# Patient Record
Sex: Female | Born: 1985 | Race: White | Hispanic: No | Marital: Single | Smoking: Never smoker
Health system: Southern US, Community
[De-identification: ages and names within clinical notes are randomized; demographics above are authoritative.]

## PROBLEM LIST (undated history)

## (undated) DIAGNOSIS — Z9889 Other specified postprocedural states: Secondary | ICD-10-CM

## (undated) DIAGNOSIS — F419 Anxiety disorder, unspecified: Secondary | ICD-10-CM

## (undated) DIAGNOSIS — L905 Scar conditions and fibrosis of skin: Secondary | ICD-10-CM

## (undated) DIAGNOSIS — D649 Anemia, unspecified: Secondary | ICD-10-CM

## (undated) DIAGNOSIS — T4145XA Adverse effect of unspecified anesthetic, initial encounter: Secondary | ICD-10-CM

## (undated) DIAGNOSIS — R112 Nausea with vomiting, unspecified: Secondary | ICD-10-CM

## (undated) DIAGNOSIS — T8859XA Other complications of anesthesia, initial encounter: Secondary | ICD-10-CM

---

## 2006-02-06 ENCOUNTER — Ambulatory Visit: Payer: Self-pay | Admitting: Family Medicine

## 2006-08-28 ENCOUNTER — Emergency Department: Payer: Self-pay | Admitting: Emergency Medicine

## 2010-03-11 ENCOUNTER — Emergency Department: Payer: Self-pay | Admitting: Unknown Physician Specialty

## 2010-08-07 ENCOUNTER — Ambulatory Visit: Payer: Self-pay | Admitting: Family Medicine

## 2017-03-29 ENCOUNTER — Ambulatory Visit
Admission: RE | Admit: 2017-03-29 | Discharge: 2017-03-29 | Disposition: A | Discharge status: Home / Self Care | Payer: BC Managed Care – PPO | Source: Ambulatory Visit | Attending: Medical Oncology | Admitting: Medical Oncology

## 2017-03-29 ENCOUNTER — Other Ambulatory Visit: Payer: Self-pay | Admitting: Medical Oncology

## 2017-03-29 DIAGNOSIS — N730 Acute parametritis and pelvic cellulitis: Secondary | ICD-10-CM

## 2017-03-29 DIAGNOSIS — N83202 Unspecified ovarian cyst, left side: Secondary | ICD-10-CM | POA: Insufficient documentation

## 2017-12-19 ENCOUNTER — Ambulatory Visit
Admission: RE | Admit: 2017-12-19 | Discharge: 2017-12-19 | Disposition: A | Discharge status: Home / Self Care | Payer: BC Managed Care – PPO | Source: Ambulatory Visit | Attending: Family Medicine | Admitting: Family Medicine

## 2017-12-19 ENCOUNTER — Other Ambulatory Visit: Payer: Self-pay | Admitting: Family Medicine

## 2017-12-19 DIAGNOSIS — R1032 Left lower quadrant pain: Secondary | ICD-10-CM | POA: Diagnosis present

## 2018-04-22 NOTE — H&P (Signed)
Makayla Knight is a 32 y.o. female here for Pelvic pain, bleeding x 3 wks . Pt with a 4 month h/o left pelvic pain . Pt was tx for PID . Pain has persisted . Sharp . Marland Kitchen. Tried on seasonale for the last 3 months without improvement in pain . She has had break through bleeding  On the OCP .   u/s 04/11/18 - normal  Past Medical History:  has no past medical history on file.  Past Surgical History:  has a past surgical history that includes Cesarean section. Family History: family history includes No Known Problems in her mother; Stroke in her paternal grandfather. Social History:  reports that she has never smoked. She has never used smokeless tobacco. She reports that she does not drink alcohol. OB/GYN History:          OB History    Gravida  1   Para  1   Term      Preterm      AB      Living  1     SAB      TAB      Ectopic      Molar      Multiple      Live Births  1          Allergies: is allergic to dimetapp [pseudoephedrine-dm] and penicillins. Medications:  Current Outpatient Medications:  .  norgestimate-ethinyl estradiol triphasic (ORTHO TRI-CYCLEN LO) 0.18/0.215/0.25 mg-25 mcg tablet, Take 1 tablet by mouth once daily, Disp: 28 tablet, Rfl: 11  Review of Systems: General:                      No fatigue or weight loss Eyes:                           No vision changes Ears:                            No hearing difficulty Respiratory:                No cough or shortness of breath Pulmonary:                  No asthma or shortness of breath Cardiovascular:           No chest pain, palpitations, dyspnea on exertion Gastrointestinal:          No abdominal bloating, chronic diarrhea, constipations, masses, pain or hematochezia Genitourinary:             No hematuria, dysuria, abnormal vaginal discharge, pelvic pain, Menometrorrhagia Lymphatic:                   No swollen lymph nodes Musculoskeletal:         No muscle weakness Neurologic:                   No extremity weakness, syncope, seizure disorder Psychiatric:                  No history of depression, delusions or suicidal/homicidal ideation    Exam:      Vitals:   04/11/18 0856  BP: 103/73  Pulse: 60    Body mass index is 22.58 kg/m.  WDWN white/  female in NAD   Lungs: CTA  CV : RRR without murmur   Neck:  no thyromegaly Abdomen: soft , no mass, normal active bowel sounds, + TTP left , no rebound tenderness Pelvic: tanner stage 5 ,  External genitalia: vulva /labia no lesions Urethra: no prolapse Vagina: 2 cc dark blood , +vaginismus  Cervix: no lesions, no cervical motion tenderness   Uterus: normal size shape and contour, non-tender Adnexa: no mass,  + TTp left adnexa , no mass  U/s : Ut wnl  Endometrium=10.40 mm  bil ovs wnl  No free fluid  Impression:   The primary encounter diagnosis was Pelvic pain in female. Diagnoses of Dyspareunia, female and Breakthrough bleeding were also pertinent to this visit.    Plan:   Recommend diagnostic possible operative scope . Possible LOA , possible excision of endometriosis        Orders Placed This Encounter

## 2018-04-23 ENCOUNTER — Other Ambulatory Visit: Payer: Self-pay

## 2018-04-23 ENCOUNTER — Encounter
Admission: RE | Admit: 2018-04-23 | Discharge: 2018-04-23 | Disposition: A | Discharge status: Home / Self Care | Payer: BC Managed Care – PPO | Source: Ambulatory Visit | Attending: Obstetrics and Gynecology | Admitting: Obstetrics and Gynecology

## 2018-04-23 HISTORY — DX: Other complications of anesthesia, initial encounter: T88.59XA

## 2018-04-23 HISTORY — DX: Nausea with vomiting, unspecified: R11.2

## 2018-04-23 HISTORY — DX: Adverse effect of unspecified anesthetic, initial encounter: T41.45XA

## 2018-04-23 HISTORY — DX: Nausea with vomiting, unspecified: Z98.890

## 2018-04-23 HISTORY — DX: Anxiety disorder, unspecified: F41.9

## 2018-04-23 NOTE — Patient Instructions (Addendum)
Your procedure is scheduled on: 05-03-18 FRIDAY Report to Same Day Surgery 2nd floor medical mall [facility](Medical Mall Entrance-take elevator on left to 2nd floor.  Check in with surgery information desk.) To find out your arrival time please call [phone] between 1PM - 3PM on 05-02-18  Remember: Instructions that are not followed completely may result in serious medical risk, up to and including death, or upon the discretion of your surgeon and anesthesiologist your surgery may need to be rescheduled.    _x___ 1. Do not eat food after midnight the night before your procedure. NO GUM OR CANDY AFTER MIDNIGHT.  You may drink clear liquids up to 2 hours before you are scheduled to arrive at the hospital for your procedure.  Do not drink clear liquids within 2 hours of your scheduled arrival to the hospital.  Clear liquids include  --Water or Apple juice without pulp  --Clear carbohydrate beverage such as ClearFast or Gatorade  --Black Coffee or Clear Tea (No milk, no creamers, do not add anything to the coffee or Tea   ____Ensure clear carbohydrate drink on the way to the hospital for bariatric patients  _X___Ensure clear carbohydrate drink 3 hours before surgery     __x__ 2. No Alcohol for 24 hours before or after surgery.   __x__3. No Smoking or e-cigarettes for 24 prior to surgery.  Do not use any chewable tobacco products for at least 6 hour prior to surgery   ____  4. Bring all medications with you on the day of surgery if instructed.    __x__ 5. Notify your doctor if there is any change in your medical condition     (cold, fever, infections).    x___6. On the morning of surgery brush your teeth with toothpaste and water.  You may rinse your mouth with mouth wash if you wish.  Do not swallow any toothpaste or mouthwash.   Do not wear jewelry, make-up, hairpins, clips or nail polish.  Do not wear lotions, powders, or perfumes. You may wear deodorant.  Do not shave 48 hours prior to  surgery. Men may shave face and neck.  Do not bring valuables to the hospital.    [facility] not responsible for any belongings or valuables.               Contacts, dentures or bridgework may not be worn into surgery.  Leave your suitcase in the car. After surgery it may be brought to your room.  For patients admitted to the hospital, discharge time is determined by your treatment team.  _  Patients discharged the day of surgery will not be allowed to drive home.  You will need someone to drive you home and stay with you the night of your procedure.    Please read over the following fact sheets that you were given:   [facility]   _x___ Take anti-hypertensive listed below, cardiac, seizure, asthma, anti-reflux and psychiatric medicines. These include:  1. NONE  2.  3.  4.  5.  6.  ____Fleets enema or Magnesium Citrate as directed.   _x___ Use CHG Soap or sage wipes as directed on instruction sheet   ____ Use inhalers on the day of surgery and bring to hospital day of surgery  ____ Stop Metformin and Janumet 2 days prior to surgery.    ____ Take 1/2 of usual insulin dose the night before surgery and none on the morning surgery.   ____ Follow  recommendations from Cardiologist, Pulmonologist or PCP regarding stopping Aspirin, Coumadin, Plavix ,Eliquis, Effient, or Pradaxa, and Pletal.  X____Stop Anti-inflammatories such as Advil, Aleve, Ibuprofen, Motrin, Naproxen, Naprosyn, Goodies powders or aspirin products NOW-OK to take Tylenol   ____ Stop supplements until after surgery.    ____ Bring C-Pap to the hospital.

## 2018-04-30 ENCOUNTER — Encounter
Admission: RE | Admit: 2018-04-30 | Discharge: 2018-04-30 | Disposition: A | Discharge status: Home / Self Care | Payer: BC Managed Care – PPO | Source: Ambulatory Visit | Attending: Obstetrics and Gynecology | Admitting: Obstetrics and Gynecology

## 2018-04-30 DIAGNOSIS — Z01818 Encounter for other preprocedural examination: Secondary | ICD-10-CM | POA: Insufficient documentation

## 2018-04-30 LAB — CBC
HCT: 38.8 % (ref 36.0–46.0)
Hemoglobin: 12.1 g/dL (ref 12.0–15.0)
MCH: 26.7 pg (ref 26.0–34.0)
MCHC: 31.2 g/dL (ref 30.0–36.0)
MCV: 85.7 fL (ref 80.0–100.0)
Platelets: 218 10*3/uL (ref 150–400)
RBC: 4.53 MIL/uL (ref 3.87–5.11)
RDW: 13.6 % (ref 11.5–15.5)
WBC: 5.1 10*3/uL (ref 4.0–10.5)
nRBC: 0 % (ref 0.0–0.2)

## 2018-04-30 LAB — BASIC METABOLIC PANEL
Anion gap: 7 (ref 5–15)
BUN: 16 mg/dL (ref 6–20)
CO2: 25 mmol/L (ref 22–32)
CREATININE: 0.75 mg/dL (ref 0.44–1.00)
Calcium: 8.9 mg/dL (ref 8.9–10.3)
Chloride: 105 mmol/L (ref 98–111)
GFR calc Af Amer: >60 mL/min (ref >60)
GFR calc non Af Amer: >60 mL/min (ref >60)
Glucose, Bld: 79 mg/dL (ref 70–99)
Potassium: 3.5 mmol/L (ref 3.5–5.1)
Sodium: 137 mmol/L (ref 135–145)

## 2018-05-03 ENCOUNTER — Encounter: Payer: Self-pay | Admitting: *Deleted

## 2018-05-03 ENCOUNTER — Other Ambulatory Visit: Payer: Self-pay

## 2018-05-03 ENCOUNTER — Encounter
Admission: RE | Disposition: A | Discharge status: Home / Self Care | Payer: Self-pay | Source: Home / Self Care | Attending: Obstetrics and Gynecology

## 2018-05-03 ENCOUNTER — Ambulatory Visit: Payer: BC Managed Care – PPO | Admitting: Anesthesiology

## 2018-05-03 ENCOUNTER — Ambulatory Visit
Admission: RE | Admit: 2018-05-03 | Discharge: 2018-05-03 | Disposition: A | Discharge status: Home / Self Care | Payer: BC Managed Care – PPO | Attending: Obstetrics and Gynecology | Admitting: Obstetrics and Gynecology

## 2018-05-03 DIAGNOSIS — G8929 Other chronic pain: Secondary | ICD-10-CM | POA: Insufficient documentation

## 2018-05-03 DIAGNOSIS — N803 Endometriosis of pelvic peritoneum: Secondary | ICD-10-CM | POA: Insufficient documentation

## 2018-05-03 DIAGNOSIS — N941 Unspecified dyspareunia: Secondary | ICD-10-CM | POA: Insufficient documentation

## 2018-05-03 DIAGNOSIS — N736 Female pelvic peritoneal adhesions (postinfective): Secondary | ICD-10-CM | POA: Insufficient documentation

## 2018-05-03 DIAGNOSIS — Z793 Long term (current) use of hormonal contraceptives: Secondary | ICD-10-CM | POA: Insufficient documentation

## 2018-05-03 DIAGNOSIS — N939 Abnormal uterine and vaginal bleeding, unspecified: Secondary | ICD-10-CM | POA: Diagnosis not present

## 2018-05-03 DIAGNOSIS — R102 Pelvic and perineal pain: Secondary | ICD-10-CM | POA: Insufficient documentation

## 2018-05-03 HISTORY — PX: LAPAROSCOPY: SHX197

## 2018-05-03 LAB — ABO/RH: ABO/RH(D): A POS

## 2018-05-03 LAB — POCT PREGNANCY, URINE: Preg Test, Ur: NEGATIVE

## 2018-05-03 SURGERY — LAPAROSCOPY OPERATIVE
Anesthesia: General

## 2018-05-03 MED ORDER — ACETAMINOPHEN 500 MG PO TABS
1000.0000 mg | ORAL_TABLET | ORAL | Status: AC
  Administered 2018-05-03: 1000 mg via ORAL

## 2018-05-03 MED ORDER — SILVER NITRATE-POT NITRATE 75-25 % EX MISC
CUTANEOUS | Status: DC | PRN
  Administered 2018-05-03: 4 via TOPICAL

## 2018-05-03 MED ORDER — ROCURONIUM BROMIDE 100 MG/10ML IV SOLN
INTRAVENOUS | Status: DC | PRN
  Administered 2018-05-03: 40 mg via INTRAVENOUS
  Administered 2018-05-03: 10 mg via INTRAVENOUS

## 2018-05-03 MED ORDER — LACTATED RINGERS IV SOLN: INTRAVENOUS | Status: DC

## 2018-05-03 MED ORDER — LACTATED RINGERS IV SOLN
INTRAVENOUS | Status: DC
  Administered 2018-05-03 (×2): via INTRAVENOUS

## 2018-05-03 MED ORDER — KETOROLAC TROMETHAMINE 30 MG/ML IJ SOLN
INTRAMUSCULAR | Status: DC | PRN
  Administered 2018-05-03: 30 mg via INTRAVENOUS

## 2018-05-03 MED ORDER — FENTANYL CITRATE (PF) 100 MCG/2ML IJ SOLN
INTRAMUSCULAR | Status: AC
  Filled 2018-05-03: qty 2

## 2018-05-03 MED ORDER — GABAPENTIN 300 MG PO CAPS
ORAL_CAPSULE | ORAL | Status: AC
  Administered 2018-05-03: 900 mg via ORAL
  Filled 2018-05-03: qty 3

## 2018-05-03 MED ORDER — SUGAMMADEX SODIUM 200 MG/2ML IV SOLN
INTRAVENOUS | Status: AC
  Filled 2018-05-03: qty 2

## 2018-05-03 MED ORDER — PROPOFOL 10 MG/ML IV BOLUS
INTRAVENOUS | Status: AC
  Filled 2018-05-03: qty 20

## 2018-05-03 MED ORDER — REMIFENTANIL HCL 1 MG IV SOLR
INTRAVENOUS | Status: AC
  Filled 2018-05-03: qty 1000

## 2018-05-03 MED ORDER — BUPIVACAINE HCL (PF) 0.5 % IJ SOLN
INTRAMUSCULAR | Status: AC
  Filled 2018-05-03: qty 30

## 2018-05-03 MED ORDER — DEXAMETHASONE SODIUM PHOSPHATE 10 MG/ML IJ SOLN
INTRAMUSCULAR | Status: DC | PRN
  Administered 2018-05-03: 10 mg via INTRAVENOUS

## 2018-05-03 MED ORDER — PROPOFOL 10 MG/ML IV BOLUS
INTRAVENOUS | Status: DC | PRN
  Administered 2018-05-03: 130 mg via INTRAVENOUS

## 2018-05-03 MED ORDER — ROCURONIUM BROMIDE 50 MG/5ML IV SOLN
INTRAVENOUS | Status: AC
  Filled 2018-05-03: qty 1

## 2018-05-03 MED ORDER — LIDOCAINE HCL (PF) 2 % IJ SOLN
INTRAMUSCULAR | Status: AC
  Filled 2018-05-03: qty 10

## 2018-05-03 MED ORDER — SODIUM CHLORIDE 0.9 % IV SOLN: INTRAVENOUS | Status: DC | PRN

## 2018-05-03 MED ORDER — PROPOFOL 500 MG/50ML IV EMUL
INTRAVENOUS | Status: DC | PRN
  Administered 2018-05-03: 150 ug/kg/min via INTRAVENOUS

## 2018-05-03 MED ORDER — KETOROLAC TROMETHAMINE 30 MG/ML IJ SOLN
INTRAMUSCULAR | Status: AC
  Filled 2018-05-03: qty 1

## 2018-05-03 MED ORDER — BUPIVACAINE HCL 0.5 % IJ SOLN
INTRAMUSCULAR | Status: DC | PRN
  Administered 2018-05-03: 8 mL

## 2018-05-03 MED ORDER — ONDANSETRON HCL 4 MG/2ML IJ SOLN
INTRAMUSCULAR | Status: DC | PRN
  Administered 2018-05-03: 4 mg via INTRAVENOUS

## 2018-05-03 MED ORDER — CELECOXIB 200 MG PO CAPS
ORAL_CAPSULE | ORAL | Status: AC
  Administered 2018-05-03: 400 mg via ORAL
  Filled 2018-05-03: qty 2

## 2018-05-03 MED ORDER — PROPOFOL 500 MG/50ML IV EMUL
INTRAVENOUS | Status: AC
  Filled 2018-05-03: qty 50

## 2018-05-03 MED ORDER — FENTANYL CITRATE (PF) 100 MCG/2ML IJ SOLN
INTRAMUSCULAR | Status: DC | PRN
  Administered 2018-05-03 (×2): 50 ug via INTRAVENOUS

## 2018-05-03 MED ORDER — REMIFENTANIL HCL 1 MG IV SOLR
INTRAVENOUS | Status: DC | PRN
  Administered 2018-05-03: .1 ug/kg/min via INTRAVENOUS

## 2018-05-03 MED ORDER — SOD CITRATE-CITRIC ACID 500-334 MG/5ML PO SOLN
30.0000 mL | ORAL | Status: DC
  Filled 2018-05-03: qty 30

## 2018-05-03 MED ORDER — CELECOXIB 200 MG PO CAPS
400.0000 mg | ORAL_CAPSULE | ORAL | Status: AC
  Administered 2018-05-03: 400 mg via ORAL

## 2018-05-03 MED ORDER — ACETAMINOPHEN 500 MG PO TABS
ORAL_TABLET | ORAL | Status: AC
  Administered 2018-05-03: 1000 mg via ORAL
  Filled 2018-05-03: qty 2

## 2018-05-03 MED ORDER — FAMOTIDINE 20 MG PO TABS
20.0000 mg | ORAL_TABLET | Freq: Once | ORAL | Status: AC
  Administered 2018-05-03: 20 mg via ORAL

## 2018-05-03 MED ORDER — MIDAZOLAM HCL 2 MG/2ML IJ SOLN
INTRAMUSCULAR | Status: DC | PRN
  Administered 2018-05-03: 2 mg via INTRAVENOUS

## 2018-05-03 MED ORDER — DEXAMETHASONE SODIUM PHOSPHATE 10 MG/ML IJ SOLN
INTRAMUSCULAR | Status: AC
  Filled 2018-05-03: qty 1

## 2018-05-03 MED ORDER — GABAPENTIN 300 MG PO CAPS
900.0000 mg | ORAL_CAPSULE | ORAL | Status: AC
  Administered 2018-05-03: 900 mg via ORAL

## 2018-05-03 MED ORDER — SCOPOLAMINE 1 MG/3DAYS TD PT72
MEDICATED_PATCH | TRANSDERMAL | Status: AC
  Filled 2018-05-03: qty 1

## 2018-05-03 MED ORDER — EPHEDRINE SULFATE 50 MG/ML IJ SOLN
INTRAMUSCULAR | Status: DC | PRN
  Administered 2018-05-03: 5 mg via INTRAVENOUS

## 2018-05-03 MED ORDER — ONDANSETRON HCL 4 MG/2ML IJ SOLN
INTRAMUSCULAR | Status: AC
  Filled 2018-05-03: qty 2

## 2018-05-03 MED ORDER — MIDAZOLAM HCL 2 MG/2ML IJ SOLN
INTRAMUSCULAR | Status: AC
  Filled 2018-05-03: qty 2

## 2018-05-03 MED ORDER — FAMOTIDINE 20 MG PO TABS
ORAL_TABLET | ORAL | Status: AC
  Administered 2018-05-03: 20 mg via ORAL
  Filled 2018-05-03: qty 1

## 2018-05-03 MED ORDER — SUCCINYLCHOLINE CHLORIDE 20 MG/ML IJ SOLN
INTRAMUSCULAR | Status: AC
  Filled 2018-05-03: qty 1

## 2018-05-03 MED ORDER — DEXMEDETOMIDINE HCL 200 MCG/2ML IV SOLN
INTRAVENOUS | Status: DC | PRN
  Administered 2018-05-03: 8 ug via INTRAVENOUS
  Administered 2018-05-03: 4 ug via INTRAVENOUS

## 2018-05-03 MED ORDER — SUGAMMADEX SODIUM 200 MG/2ML IV SOLN
INTRAVENOUS | Status: DC | PRN
  Administered 2018-05-03: 200 mg via INTRAVENOUS

## 2018-05-03 SURGICAL SUPPLY — 38 items
ANCHOR TIS RET SYS 235ML (MISCELLANEOUS) ×1 IMPLANT
BLADE SURG SZ11 CARB STEEL (BLADE) ×3 IMPLANT
CANISTER SUCT 1200ML W/VALVE (MISCELLANEOUS) ×3 IMPLANT
CATH FOLEY 2WAY  5CC 16FR (CATHETERS) ×2
CATH ROBINSON RED A/P 16FR (CATHETERS) ×3 IMPLANT
CATH URTH 16FR FL 2W BLN LF (CATHETERS) ×1 IMPLANT
CHLORAPREP W/TINT 26ML (MISCELLANEOUS) ×3 IMPLANT
COVER WAND RF STERILE (DRAPES) ×3 IMPLANT
DERMABOND ADVANCED (GAUZE/BANDAGES/DRESSINGS)
DERMABOND ADVANCED .7 DNX12 (GAUZE/BANDAGES/DRESSINGS) ×1 IMPLANT
DRSG TELFA 4X3 1S NADH ST (GAUZE/BANDAGES/DRESSINGS) ×2 IMPLANT
GLOVE BIO SURGEON STRL SZ8 (GLOVE) ×6 IMPLANT
GOWN STRL REUS W/ TWL LRG LVL3 (GOWN DISPOSABLE) ×1 IMPLANT
GOWN STRL REUS W/ TWL XL LVL3 (GOWN DISPOSABLE) ×1 IMPLANT
GOWN STRL REUS W/TWL LRG LVL3 (GOWN DISPOSABLE) ×2
GOWN STRL REUS W/TWL XL LVL3 (GOWN DISPOSABLE) ×2
GRASPER SUT TROCAR 14GX15 (MISCELLANEOUS) ×3 IMPLANT
IRRIGATION STRYKERFLOW (MISCELLANEOUS) ×1 IMPLANT
IRRIGATOR STRYKERFLOW (MISCELLANEOUS)
IV NS 1000ML (IV SOLUTION) ×2
IV NS 1000ML BAXH (IV SOLUTION) ×1 IMPLANT
KIT TURNOVER CYSTO (KITS) ×3 IMPLANT
LABEL OR SOLS (LABEL) ×3 IMPLANT
NS IRRIG 500ML POUR BTL (IV SOLUTION) ×3 IMPLANT
PACK GYN LAPAROSCOPIC (MISCELLANEOUS) ×3 IMPLANT
PAD OB MATERNITY 4.3X12.25 (PERSONAL CARE ITEMS) ×3 IMPLANT
PAD PREP 24X41 OB/GYN DISP (PERSONAL CARE ITEMS) ×3 IMPLANT
SCISSORS METZENBAUM CVD 33 (INSTRUMENTS) ×1 IMPLANT
SHEARS HARMONIC ACE PLUS 36CM (ENDOMECHANICALS) ×3 IMPLANT
SLEEVE ENDOPATH XCEL 5M (ENDOMECHANICALS) ×6 IMPLANT
SUT VIC AB 0 CT1 36 (SUTURE) ×3 IMPLANT
SUT VIC AB 2-0 UR6 27 (SUTURE) ×5 IMPLANT
SUT VIC AB 4-0 SH 27 (SUTURE) ×2
SUT VIC AB 4-0 SH 27XANBCTRL (SUTURE) ×2 IMPLANT
TROCAR ENDO BLADELESS 11MM (ENDOMECHANICALS) ×1 IMPLANT
TROCAR XCEL NON-BLD 5MMX100MML (ENDOMECHANICALS) ×3 IMPLANT
TROCAR XCEL UNIV SLVE 11M 100M (ENDOMECHANICALS) ×1 IMPLANT
TUBING INSUFFLATION (TUBING) ×3 IMPLANT

## 2018-05-03 NOTE — Discharge Instructions (Addendum)
Laparoscopic Lysis of Abdominal Adhesions Laparoscopic lysis of abdominal adhesions is a surgical procedure to remove tough (fibrous) bands of tissue from the abdomen. Adhesions are like scars, but they form on the inside of the body. They are caused by inflammation and often develop after a previous surgery or infection as part of the healing process. You may need this procedure if you have abdominal adhesions that are causing pain or other symptoms. During the procedure, the surgeon inserts a thin tube that has a light and camera on the end of it (laparoscope) into the abdomen. The camera sends images to a screen in the operating room, and these images are used to help guide the surgery. This type of surgery is done through small incisions, rather than one large incision. Tell a health care provider about:  Any allergies you have.  All medicines you are taking, including vitamins, herbs, eye drops, creams, and over-the-counter medicines.  Any problems you or family members have had with anesthetic medicines.  Any blood disorders you have.  Any surgeries you have had.  Any medical conditions you have.  Whether you are pregnant or may be pregnant. What are the risks? Generally, this is a safe procedure. However, problems may occur, including:  Infection.  Bleeding.  Allergic reactions to medicines.  Damage to abdominal organs.  Development of more abdominal adhesions. What happens before the procedure? Staying hydrated Follow instructions from your health care provider about hydration, which may include:  Up to 2 hours before the procedure - you may continue to drink clear liquids, such as water, clear fruit juice, black coffee, and plain tea. Eating and drinking restrictions Follow instructions from your health care provider about eating and drinking, which may include:  8 hours before the procedure - stop eating heavy meals or foods such as meat, fried foods, or fatty  foods.  6 hours before the procedure - stop eating light meals or foods, such as toast or cereal.  6 hours before the procedure - stop drinking milk or drinks that contain milk.  2 hours before the procedure - stop drinking clear liquids. Medicines Ask your health care provider about:  Changing or stopping your regular medicines. This is especially important if you are taking diabetes medicines or blood thinners.  Taking medicines such as aspirin and ibuprofen. These medicines can thin your blood. Do not take these medicines unless your health care provider tells you to take them.  Taking over-the-counter medicines, vitamins, herbs, and supplements. General instructions  Plan to have someone take you home from the hospital or clinic.  Plan to have a responsible adult care for you for at least 24 hours after you leave the hospital or clinic. This is important.  Ask your health care provider how your surgical site will be marked or identified.  You may be asked to shower with a germ-killing soap.  You may have imaging tests and blood tests.  Ask your health care provider what steps will be taken to help prevent infection. These may include: ? Removing hair at the surgery site. ? Washing skin with a germ-killing soap. ? Antibiotic medicine. What happens during the procedure?   An IV will be inserted into one of your veins.  You will be given a medicine to make you fall asleep (general anesthetic).  A tube may be passed through your nose and into your stomach (nasogastric tube).  The surgeon will make a small incision through the wall of your abdomen.  Your abdomen  will be filled with a gas. This will help your surgeon see the inside of your abdomen more clearly. It will also give the surgeon more room to operate.  A laparoscope with a camera on the end will be passed through the incision. It will send images to a monitor in the operating room.  Other small incisions may be  made in your abdomen. Long, thin surgical instruments will be inserted through them as needed.  The adhesions will be cut with surgical scissors or removed with some form of energy, such as electric current or laser energy.  The incisions will be closed with adhesive strips or stitches (sutures).  A bandage (dressing) will be placed over the incisions. The procedure may vary among health care providers and hospitals. What happens after the procedure?  Your blood pressure, heart rate, breathing rate, and blood oxygen level will be monitored until you leave the hospital or clinic.  You may have some pain. You will get pain medicine as needed.  You will be encouraged to get up and walk around while you are still in the hospital.  If you have a nasogastric tube, it will be removed when your bowel function returns. After that, you will be allowed to eat or drink as usual.  When you are taking fluids well, your IV will be removed. Summary  Laparoscopic lysis of abdominal adhesions is a surgical procedure to remove scar tissue (adhesions) from inside your body.  You may need this procedure if you have abdominal adhesions that are causing pain or other symptoms.  After surgery, you will receive medicines and fluid by vein until your bowel function returns to normal. After that, you will be allowed to eat or drink as usual. This information is not intended to replace advice given to you by your health care provider. Make sure you discuss any questions you have with your health care provider. Document Released: 07/07/2008 Document Revised: 03/28/2017 Document Reviewed: 03/28/2017 Elsevier Interactive Patient Education  2019 [employer]   AMBULATORY SURGERY  DISCHARGE INSTRUCTIONS   1) The drugs that you were given will stay in your system until tomorrow so for the next 24 hours you should not:  A) Drive an automobile B) Make any legal decisions C) Drink any alcoholic  beverage   2) You may resume regular meals tomorrow.  Today it is better to start with liquids and gradually work up to solid foods.  You may eat anything you prefer, but it is better to start with liquids, then soup and crackers, and gradually work up to solid foods.   3) Please notify your doctor immediately if you have any unusual bleeding, trouble breathing, redness and pain at the surgery site, drainage, fever, or pain not relieved by medication.    4) Additional Instructions:        Please contact your physician with any problems or Same Day Surgery at [phone], Monday through Friday 6 am to 4 pm, or [place] [facility] number at [phone].

## 2018-05-03 NOTE — Anesthesia Preprocedure Evaluation (Addendum)
Anesthesia Evaluation  Patient identified by MRN, date of birth, ID band Patient awake    Reviewed: Allergy & Precautions, H&P , NPO status , Patient's Chart, lab work & pertinent test results  History of Anesthesia Complications (+) PONV and history of anesthetic complications (severe PONV.  Easily motion sick.  Mother also has severe PONV.)  Airway Mallampati: I  TM Distance: >3 FB     Dental  (+) Teeth Intact   Pulmonary neg pulmonary ROS,           Cardiovascular negative cardio ROS       Neuro/Psych PSYCHIATRIC DISORDERS Anxiety negative neurological ROS     GI/Hepatic negative GI ROS, Neg liver ROS,   Endo/Other  negative endocrine ROS  Renal/GU      Musculoskeletal   Abdominal   Peds  Hematology negative hematology ROS (+)   Anesthesia Other Findings Past Medical History: No date: Anxiety     Comment:  H/O No date: Complication of anesthesia     Comment:  EPIDURAL DID NOT WORK HAD TO DO A SPINAL No date: PONV (postoperative nausea and vomiting)  Past Surgical History: 2008: CESAREAN SECTION  BMI    Body Mass Index:  21.93 kg/m      Reproductive/Obstetrics negative OB ROS                            Anesthesia Physical Anesthesia Plan  ASA: II  Anesthesia Plan: General ETT   Post-op Pain Management:    Induction:   PONV Risk Score and Plan: 4 or greater and Ondansetron, Dexamethasone, Treatment may vary due to age or medical condition, Scopolamine patch - Pre-op, Midazolam, Propofol infusion and TIVA  Airway Management Planned:   Additional Equipment:   Intra-op Plan:   Post-operative Plan:   Informed Consent: I have reviewed the patients History and Physical, chart, labs and discussed the procedure including the risks, benefits and alternatives for the proposed anesthesia with the patient or authorized representative who has indicated his/her understanding  and acceptance.   Dental Advisory Given  Plan Discussed with: Anesthesiologist, CRNA and Surgeon  Anesthesia Plan Comments:       Anesthesia Quick Evaluation

## 2018-05-03 NOTE — Anesthesia Postprocedure Evaluation (Signed)
Anesthesia Post Note  Patient: Makayla Knight  Procedure(s) Performed: LAPAROSCOPY OPERATIVE , LYSIS OF ADHESIONS (N/A )  Patient location during evaluation: PACU Anesthesia Type: General Level of consciousness: awake and alert Pain management: pain level controlled Vital Signs Assessment: post-procedure vital signs reviewed and stable Respiratory status: spontaneous breathing, nonlabored ventilation, respiratory function stable and patient connected to nasal cannula oxygen Cardiovascular status: blood pressure returned to baseline and stable Postop Assessment: no apparent nausea or vomiting Anesthetic complications: no     Last Vitals:  Vitals:   05/03/18 1455 05/03/18 1458  BP:  118/66  Pulse:  61  Resp:  18  Temp: 36.7 C 36.7 C  SpO2:  100%    Last Pain:  Vitals:   05/03/18 1458  TempSrc: Temporal  PainSc: 0-No pain                 Jovita Gamma

## 2018-05-03 NOTE — Progress Notes (Signed)
Pt ready for operative L/S . hcg neg . All questions answered  Proceed

## 2018-05-03 NOTE — Anesthesia Post-op Follow-up Note (Signed)
Anesthesia QCDR form completed.        

## 2018-05-03 NOTE — Anesthesia Procedure Notes (Signed)
Procedure Name: Intubation Date/Time: 05/03/2018 12:55 PM Performed by: Lavone Orn, CRNA Pre-anesthesia Checklist: Patient identified, Emergency Drugs available, Suction available, Patient being monitored and Timeout performed Patient Re-evaluated:Patient Re-evaluated prior to induction Oxygen Delivery Method: Circle system utilized Preoxygenation: Pre-oxygenation with 100% oxygen Induction Type: IV induction Ventilation: Mask ventilation without difficulty Laryngoscope Size: Mac and 3 Grade View: Grade I Tube type: Oral Tube size: 7.0 mm Number of attempts: 1 Airway Equipment and Method: Stylet Placement Confirmation: ETT inserted through vocal cords under direct vision,  positive ETCO2 and breath sounds checked- equal and bilateral Secured at: 22 cm Tube secured with: Tape Dental Injury: Teeth and Oropharynx as per pre-operative assessment

## 2018-05-03 NOTE — Transfer of Care (Signed)
Immediate Anesthesia Transfer of Care Note  Patient: Makayla Knight  Procedure(s) Performed: LAPAROSCOPY OPERATIVE , LYSIS OF ADHESIONS (N/A )  Patient Location: PACU  Anesthesia Type:General  Level of Consciousness: awake and drowsy  Airway & Oxygen Therapy: Patient Spontanous Breathing and Patient connected to nasal cannula oxygen  Post-op Assessment: Report given to RN and Post -op Vital signs reviewed and stable  Post vital signs: stable  Last Vitals:  Vitals Value Taken Time  BP 106/59 05/03/2018  2:16 PM  Temp    Pulse 68 05/03/2018  2:29 PM  Resp 15 05/03/2018  2:29 PM  SpO2 100 % 05/03/2018  2:29 PM  Vitals shown include unvalidated device data.  Last Pain:  Vitals:   05/03/18 1415  TempSrc:   PainSc: 0-No pain      Patients Stated Pain Goal: 3 (05/03/18 1022)  Complications: No apparent anesthesia complications

## 2018-05-03 NOTE — Brief Op Note (Signed)
05/03/2018  1:57 PM  PATIENT:  Makayla Knight  33 y.o. female  PRE-OPERATIVE DIAGNOSIS:  chronic pelvic pain  POST-OPERATIVE DIAGNOSIS:  chronic pelvic pain Pelvic adhesions  pelvic endometriosis PROCEDURE:  Procedure(s): LAPAROSCOPY OPERATIVE , LYSIS OF ADHESIONS (N/A) Multiple excisional biopsies odf pelvic endometriosis SURGEON:  Surgeon(s) and Role:    * Schermerhorn, Ihor Austin, MD - Primary  PHYSICIAN ASSISTANT: scrub tech   ASSISTANTS: none   ANESTHESIA:   general  EBL:  Minimal ., IOF 1000 cc, UO 75 cc   BLOOD ADMINISTERED:none  DRAINS: none   LOCAL MEDICATIONS USED:  MARCAINE     SPECIMEN:  Source of Specimen:  excision of pelvic endometriosis  DISPOSITION OF SPECIMEN:  PATHOLOGY  COUNTS:  YES  TOURNIQUET:  * No tourniquets in log *  DICTATION: .Other Dictation: Dictation Number verbal  PLAN OF CARE: Discharge to home after PACU  PATIENT DISPOSITION:  PACU - hemodynamically stable.   Delay start of Pharmacological VTE agent (>24hrs) due to surgical blood loss or risk of bleeding: not applicable

## 2018-05-04 LAB — TYPE AND SCREEN
ABO/RH(D): A POS
Antibody Screen: POSITIVE
Unit division: 0
Unit division: 0

## 2018-05-04 LAB — BPAM RBC
Blood Product Expiration Date: 202002062359
Blood Product Expiration Date: 202002062359
Unit Type and Rh: 6200
Unit Type and Rh: 6200

## 2018-05-06 ENCOUNTER — Encounter: Payer: Self-pay | Admitting: Obstetrics and Gynecology

## 2018-05-07 LAB — SURGICAL PATHOLOGY

## 2018-05-09 NOTE — Op Note (Signed)
NAME: PANG, GODINHO MEDICAL RECORD VO:[id number] ACCOUNT [id number] DATE OF BIRTH:[birth year] FACILITY: [facility] LOCATION: [facility] PHYSICIAN:Mayumi Summerson Cloyde Reams, MD  OPERATIVE REPORT  DATE OF PROCEDURE:  05/03/2018  PREOPERATIVE DIAGNOSIS:  Chronic pelvic pain.  POSTOPERATIVE DIAGNOSES: 1.  Chronic pelvic pain. 2.  Abdominopelvic adhesions. 3.  Endometriosis.  PROCEDURE:   1.  Laparoscopic excision of endometriosis. 2.  Laparoscopic lysis of adhesions.  ANESTHESIA:  General endotracheal anesthesia.  SURGEON:  Suzy Bouchard, MD  FIRST ASSISTANT:  Scrub tech.  INDICATIONS:  A 33 year old female with a long history of chronic pelvic pain and dyspareunia.    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  Pelvic adhesions and abdominal adhesions noted.  DESCRIPTION OF PROCEDURE:  After adequate general endotracheal anesthesia, the patient was placed in dorsal supine position, legs in the [place] stirrups.  The patient's abdomen, perineum and vagina were prepped and draped in normal sterile fashion.   Timeout was performed.  A speculum was placed in the vagina and the cervix was grasped with a single tooth tenaculum and a Kahn cannula was placed to be used for uterine manipulation during the procedure.  Gloves were changed.  An infraumbilical incision  was made after injecting with 0.5% Marcaine.  A 5 mm laparoscope was advanced into the abdominal cavity under direct visualization with the Optiview cannula.  Once inside the abdominal cavity, the patient's abdomen was insufflated with carbon dioxide.   A second port site was placed in the left lower quadrant 3 cm medial to the left anterior iliac spine.  A third port site was placed in the right lower quadrant, again 3 cm medial to the right anterior iliac spine.  Initial impression revealed multiple  pelvic adhesions the omentum to the upper mid portion of the abdomen.   There were adhesions from the bowel noted to the underside of the liver capsule.  There were several deep peritoneal windows noted in the left uterosacral area and one on the right  pelvic side wall.  Harmonic scalpel was brought up and the adhesions were removed followed by picking up the Allen-Masters windows on the left uterosacral area and dissecting these areas of endometriosis.  The right pelvic sidewall Allen-Masters window  was everted and excised with the Harmonic scalpel.  Good hemostasis was noted.  Pressure was lowered to 7 mmHg and good hemostasis was noted.  The procedure was terminated.  Carbon dioxide was released from the patient's abdomen.  Normal ureteral  function was documented bilaterally at the end of dissection.  The single toothed cannula was removed with the Kahn cannula removed.  Silver nitrate was used for the tenaculum sites.  COMPLICATIONS:  There were no complications.  ESTIMATED BLOOD LOSS:  Minimal.  INTRAOPERATIVE FLUIDS:  1000 mL.  Of note, the patient's bladder was drained prior to the laparoscopy yielding 75 mL of clear urine.  TN/NUANCE  D:05/09/2018 T:05/09/2018 JOB:[id number]

## 2018-05-17 ENCOUNTER — Encounter: Payer: Self-pay | Admitting: Obstetrics and Gynecology

## 2019-03-11 ENCOUNTER — Other Ambulatory Visit: Payer: Self-pay | Admitting: Obstetrics and Gynecology

## 2019-03-11 DIAGNOSIS — IMO0002 Reserved for concepts with insufficient information to code with codable children: Secondary | ICD-10-CM

## 2019-03-11 DIAGNOSIS — R1032 Left lower quadrant pain: Secondary | ICD-10-CM

## 2019-03-11 DIAGNOSIS — N803 Endometriosis of pelvic peritoneum: Secondary | ICD-10-CM

## 2019-03-11 DIAGNOSIS — R102 Pelvic and perineal pain: Secondary | ICD-10-CM

## 2019-03-24 ENCOUNTER — Other Ambulatory Visit: Payer: Self-pay

## 2019-03-24 ENCOUNTER — Ambulatory Visit
Admission: RE | Admit: 2019-03-24 | Discharge: 2019-03-24 | Disposition: A | Discharge status: Home / Self Care | Payer: BC Managed Care – PPO | Source: Ambulatory Visit | Attending: Obstetrics and Gynecology | Admitting: Obstetrics and Gynecology

## 2019-03-24 DIAGNOSIS — N803 Endometriosis of pelvic peritoneum: Secondary | ICD-10-CM | POA: Diagnosis present

## 2019-03-24 DIAGNOSIS — R102 Pelvic and perineal pain: Secondary | ICD-10-CM | POA: Insufficient documentation

## 2019-03-24 DIAGNOSIS — R1032 Left lower quadrant pain: Secondary | ICD-10-CM | POA: Insufficient documentation

## 2019-03-24 DIAGNOSIS — IMO0002 Reserved for concepts with insufficient information to code with codable children: Secondary | ICD-10-CM

## 2019-03-31 NOTE — H&P (Addendum)
Makayla Knight is a 33 y.o. female here for TAH / LSO  And right salpingectomy . MakaylaAkersis a 33 y.o.femalehere for Follow-up (reevaluate endometriosis, off and on bleeding x 4 weeks)  Left inguinal pain prompted a recent MRI 03/24/2019:  EXAM: MRI PELVIS WITHOUT CONTRAST  TECHNIQUE: Multiplanar multisequence MR imaging of the pelvis was performed. No intravenous contrast was administered.  COMPARISON: Pelvic ultrasound dated 12/11/2017  FINDINGS: Urinary Tract: Bladder is underdistended.  Bowel: Visualized bowel is unremarkable.  Vascular/Lymphatic: No evidence of aneurysm.  No suspicious pelvic lymphadenopathy.  Reproductive: Uterus is within normal limits. Endometrial complex measures 6 mm.  Bilateral ovaries are within normal limits, noting a 2.6 cm right corpus luteum, physiologic.  Other: No pelvic ascites.  On precontrast T1 imaging, there are no hemorrhagic lesions demonstrating intrinsic T1 shortening to suggest endometriosis.  Musculoskeletal: No focal osseous lesions.  IMPRESSION: No findings suspicious for endometriosis on unenhanced MR.  2.6 cm physiologic right corpus luteum  .pt with bx proven endometriosis on L/S 01/ 2020. Pt was on Depo Lupron but didn't tolerate it well . Nowon 36month of continuous OCP . PAin left side still baseline throughout the day . Before surgery it was 9/10 , post surgery on Lupron 8-9/10 , now on OCPS 6-7/10. No engaging in sexual activity before she doesn't want to stir up pain . Currently she is taking Gabapentin 300 mg qhs , cant tolerate the 600 mg at night . .Marland Kitchen She c/o of being tired all the time At time of surgery 05/03/2018 showed Multiple pelvic adhesions and omental adhesions.  Pain still isolated left side And left inguinal area . Pain started several years after her cesarean section   Pt has c/o of irregular heavy bleeding for the last few months , now bleeding 12 days per month .  She states she has left  pelvic pain with insertion of a tampon  No significant urinary symptoms that would be c/w with IC    Past Medical History:  has a past medical history of Dysmenorrhea, Endometriosis, and Pelvic pain.  Past Surgical History:  has a past surgical history that includes Cesarean section and Laparscopic excision of Endometriosis and LOA (05/03/2018). Family History: family history includes No Known Problems in her mother; Stroke in her paternal grandfather. Social History:  reports that she has never smoked. She has never used smokeless tobacco. She reports that she does not drink alcohol or use drugs. OB/GYN History:  OB History    Gravida  1   Para  1   Term      Preterm      AB      Living  1     SAB      TAB      Ectopic      Molar      Multiple      Live Births  1          Allergies: is allergic to dimetapp [pseudoephedrine-dm] and penicillins. Medications:  Current Outpatient Medications:  .  cetirizine (ZYRTEC) 10 MG tablet, Take 10 mg by mouth once daily, Disp: , Rfl:  .  docusate (COLACE) 100 MG capsule, Take 1 capsule (100 mg total) by mouth 2 (two) times daily, Disp: 30 capsule, Rfl: 0 .  ferrous sulfate 325 (65 FE) MG EC tablet, Take 1 tablet (325 mg total) by mouth 2 (two) times daily with meals, Disp: 30 tablet, Rfl: 2 .  gabapentin (NEURONTIN) 300 MG capsule, Take 2 capsules (600  mg total) by mouth nightly (Patient taking differently: Take 300 mg by mouth nightly   ), Disp: 60 capsule, Rfl: 11 .  fluticasone propionate (FLONASE) 50 mcg/actuation nasal spray, SPRAY 2 SPRAYS INTO EACH NOSTRIL EVERY DAY, Disp: 16 g, Rfl: 3 .  leuprolide (LUPRON DEPOT 1 MONTH) 3.75 mg IM depot syringe kit, Bring to office for IM injection (Patient not taking: Reported on 09/18/2018  ), Disp: 1 kit, Rfl: 0 .  levonorgestrel-ethinyl estradiol (SEASONALE) 0.15 mg-30 mcg (91) tablet, Take 1 tablet by mouth once daily, Disp: 84 tablet, Rfl: 3  Review of Systems: General:                       No fatigue or weight loss Eyes:                           No vision changes Ears:                            No hearing difficulty Respiratory:                No cough or shortness of breath Pulmonary:                  No asthma or shortness of breath Cardiovascular:           No chest pain, palpitations, dyspnea on exertion Gastrointestinal:          No abdominal bloating, chronic diarrhea, constipations, masses, pain or hematochezia Genitourinary:             No hematuria, dysuria, abnormal vaginal discharge,  ++pelvic pain, +Menometrorrhagia Lymphatic:                   No swollen lymph nodes Musculoskeletal:         No muscle weakness Neurologic:                  No extremity weakness, syncope, seizure disorder Psychiatric:                  No history of depression, delusions or suicidal/homicidal ideation     Exam:      Vitals:   03/28/19 1610  BP: 140/79  Pulse: 83    Body mass index is 25.6 kg/m.  WDWN white/  female in NAD   Lungs: CTA  CV : RRR without murmur    Neck:  no thyromegaly Abdomen: soft , no mass, normal active bowel sounds, ++ TTP left lateral to pfannenstiel incision and into left inguinal canal ( pain is superficial with light Touch). No hernia appreciated with valsalva Pelvic: tanner stage 5 ,  External genitalia: vulva /labia no lesions Urethra: no prolapse Vagina:+ vaginismus , bloody d/c .Cervix: no lesions,+ cmt, limited room for vaginal hysterectomy  Uterus:Anterior ,normal size shape and contour,+ TTP Adnexa:no mass, non-tender   Impression:   The primary encounter diagnosis was Pelvic pain in female. Diagnoses of Endometriosis of pelvis and Menorrhagia with irregular cycle were also pertinent to this visit.  Surgical and multiple treatment options have not alleviated the patient's pain  Inguinal pain is not typical for endometriosis and MRI has failed to show abnormality  Plan:   I have  recommended a TAH and LSO and right salpingectomy  given all pain has been consistently on her left  She is aware that her pain may  not improve after surgery and she will need to continue on OCP to control additional spread of endometriosis .  Benefits and risks to surgery: The proposed benefit of the surgery has been discussed with the patient. The possible risks include, but are not limited to: organ injury to the bowel , bladder, ureters, and major blood vessels and nerves. There is a possibility of additional surgeries resulting from these injuries. There is also the risk of blood transfusion and the need to receive blood products during or after the procedure which may rarely lead to HIV or Hepatitis C infection. There is a risk of developing a deep venous thrombosis or a pulmonary embolism . There is the possibility of wound infection and also anesthetic complications, even the rare possibility of death. The patient understands these risks and wishes to proceed.      Caroline Sauger, MD

## 2019-04-03 ENCOUNTER — Encounter
Admission: RE | Admit: 2019-04-03 | Discharge: 2019-04-03 | Disposition: A | Discharge status: Home / Self Care | Payer: BC Managed Care – PPO | Source: Ambulatory Visit | Attending: Obstetrics and Gynecology | Admitting: Obstetrics and Gynecology

## 2019-04-03 ENCOUNTER — Other Ambulatory Visit: Payer: Self-pay

## 2019-04-03 DIAGNOSIS — Z01812 Encounter for preprocedural laboratory examination: Secondary | ICD-10-CM | POA: Diagnosis present

## 2019-04-03 DIAGNOSIS — Z20828 Contact with and (suspected) exposure to other viral communicable diseases: Secondary | ICD-10-CM | POA: Diagnosis not present

## 2019-04-03 HISTORY — DX: Scar conditions and fibrosis of skin: L90.5

## 2019-04-03 HISTORY — DX: Anemia, unspecified: D64.9

## 2019-04-03 LAB — CBC
HCT: 38.8 % (ref 36.0–46.0)
Hemoglobin: 12.8 g/dL (ref 12.0–15.0)
MCH: 26.5 pg (ref 26.0–34.0)
MCHC: 33 g/dL (ref 30.0–36.0)
MCV: 80.3 fL (ref 80.0–100.0)
Platelets: 273 10*3/uL (ref 150–400)
RBC: 4.83 MIL/uL (ref 3.87–5.11)
RDW: 13.6 % (ref 11.5–15.5)
WBC: 5.7 10*3/uL (ref 4.0–10.5)
nRBC: 0 % (ref 0.0–0.2)

## 2019-04-03 LAB — BASIC METABOLIC PANEL
Anion gap: 8 (ref 5–15)
BUN: 15 mg/dL (ref 6–20)
CO2: 26 mmol/L (ref 22–32)
Calcium: 9 mg/dL (ref 8.9–10.3)
Chloride: 103 mmol/L (ref 98–111)
Creatinine, Ser: 0.91 mg/dL (ref 0.44–1.00)
GFR calc Af Amer: >60 mL/min (ref >60)
GFR calc non Af Amer: >60 mL/min (ref >60)
Glucose, Bld: 89 mg/dL (ref 70–99)
Potassium: 4.1 mmol/L (ref 3.5–5.1)
Sodium: 137 mmol/L (ref 135–145)

## 2019-04-03 LAB — TYPE AND SCREEN
ABO/RH(D): A POS
Antibody Screen: NEGATIVE

## 2019-04-03 NOTE — Patient Instructions (Signed)
Your procedure is scheduled on: 04-07-19 MONDAY Report to Same Day Surgery 2nd floor medical mall [facility] Entrance-take elevator on left to 2nd floor.  Check in with surgery information desk.) To find out your arrival time please call [phone] between 1PM - 3PM on 04-04-19 FRIDAY  Remember: Instructions that are not followed completely may result in serious medical risk, up to and including death, or upon the discretion of your surgeon and anesthesiologist your surgery may need to be rescheduled.    _x___ 1. Do not eat food after midnight the night before your procedure. NO GUM OR CANDY AFTER MIDNIGHT. You may drink clear liquids up to 2 hours before you are scheduled to arrive at the hospital for your procedure.  Do not drink clear liquids within 2 hours of your scheduled arrival to the hospital.  Clear liquids include  --Water or Apple juice without pulp  --Gatorade  --Black Coffee or Clear Tea (No milk, no creamers, do not add anything to the coffee or Tea   ____Ensure clear carbohydrate drink on the way to the hospital for bariatric patients  _X___Ensure clear carbohydrate drink 3 hours PRIOR TO ARRIVAL TIME TO HOSPITAL    __x__ 2. No Alcohol for 24 hours before or after surgery.   __x__3. No Smoking or e-cigarettes for 24 prior to surgery.  Do not use any chewable tobacco products for at least 6 hour prior to surgery   ____  4. Bring all medications with you on the day of surgery if instructed.    __x__ 5. Notify your doctor if there is any change in your medical condition     (cold, fever, infections).    x___6. On the morning of surgery brush your teeth with toothpaste and water.  You may rinse your mouth with mouth wash if you wish.  Do not swallow any toothpaste or mouthwash.   Do not wear jewelry, make-up, hairpins, clips or nail polish.  Do not wear lotions, powders, or perfumes.   Do not shave 48 hours prior to surgery. Men may shave face and neck.  Do not  bring valuables to the hospital.    [facility] is not responsible for any belongings or valuables.               Contacts, dentures or bridgework may not be worn into surgery.  Leave your suitcase in the car. After surgery it may be brought to your room.  For patients admitted to the hospital, discharge time is determined by your treatment team.  _  Patients discharged the day of surgery will not be allowed to drive home.  You will need someone to drive you home and stay with you the night of your procedure.    Please read over the following fact sheets that you were given:   [facility] Preparing for Surgery  ____ Take anti-hypertensive listed below, cardiac, seizure, asthma,anti-reflux and psychiatric medicines. These include:  1. NONE  2.  3.  4.  5.  6.  ____Fleets enema or Magnesium Citrate as directed.   _x___ Use CHG Soap or sage wipes as directed on instruction sheet   ____ Use inhalers on the day of surgery and bring to hospital day of surgery  ____ Stop Metformin and Janumet 2 days prior to surgery.    ____ Take 1/2 of usual insulin dose the night before surgery and none on the morning surgery.   ____ Follow recommendations from Cardiologist, Pulmonologist or PCP regarding stopping Aspirin,  Coumadin, Plavix ,Eliquis, Effient, or Pradaxa, and Pletal.  X____Stop Anti-inflammatories such as Advil, Aleve, Ibuprofen, Motrin, Naproxen, Naprosyn, Goodies powders or aspirin products NOW-OK to take Tyleno   ____ Stop supplements until after surgery.    ____ Bring C-Pap to the hospital.

## 2019-04-05 LAB — NOVEL CORONAVIRUS, NAA (HOSP ORDER, SEND-OUT TO REF LAB; TAT 18-24 HRS): SARS-CoV-2, NAA: NOT DETECTED

## 2019-04-07 ENCOUNTER — Inpatient Hospital Stay
Admission: RE | Admit: 2019-04-07 | Payer: BC Managed Care – PPO | Source: Home / Self Care | Admitting: Obstetrics and Gynecology

## 2019-04-07 ENCOUNTER — Encounter: Admission: RE | Payer: Self-pay | Source: Home / Self Care

## 2019-04-07 SURGERY — HYSTERECTOMY, TOTAL, ABDOMINAL, WITH SALPINGECTOMY
Anesthesia: General | Laterality: Right

## 2020-10-14 IMAGING — MR MR PELVIS W/O CM
7 of 8 series · 32 of 48 positions shown · non-contrast
Comparison: Pelvic ultrasound dated 12/11/2017

CLINICAL DATA: Intermittent left lower quadrant abdominal pain,
endometriosis

EXAM:
MRI PELVIS WITHOUT CONTRAST
TECHNIQUE: Multiplanar multisequence MR imaging of the pelvis was performed. No
intravenous contrast was administered.

[Series 2: T2 · coronal · 5.0mm · 1.41mm/px · 2 of 30 slices shown (1 of 3)]
[im 1/30]
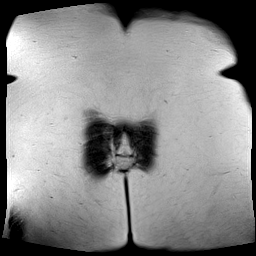
[im 30/30]
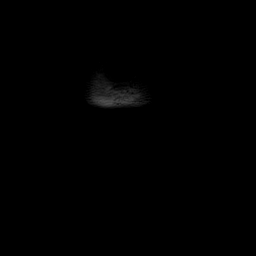

[Series 3: T2 · axial · 5.0mm · 0.94mm/px · z∈[-173,+61]mm · 4 of 40 slices shown (2 of 3)]
[im 1/40]
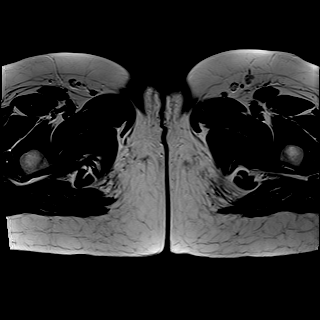
[im 14/40]
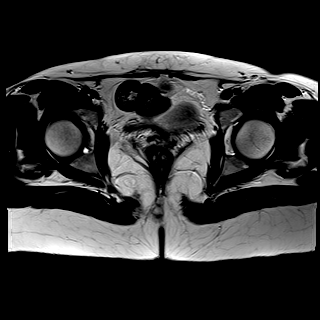
[im 27/40]
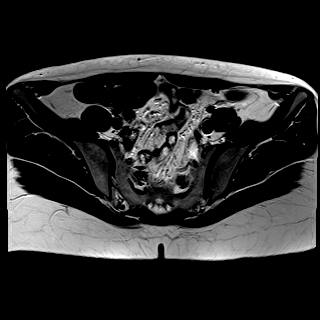
[im 40/40]
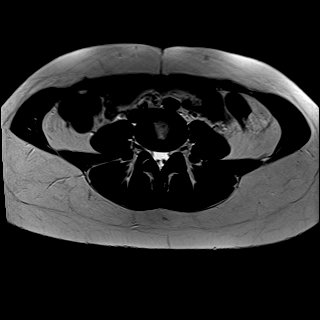

[Series 4: T2 fat-sat · axial · 5.0mm · 0.90mm/px · z∈[-165,+69]mm · 4 of 40 slices shown]
[im 1/40]
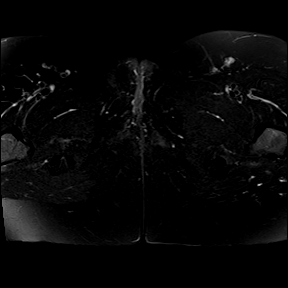
[im 14/40]
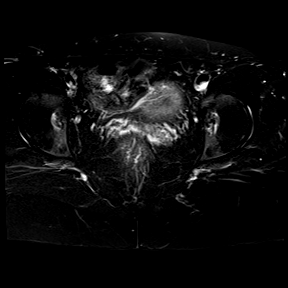
[im 27/40]
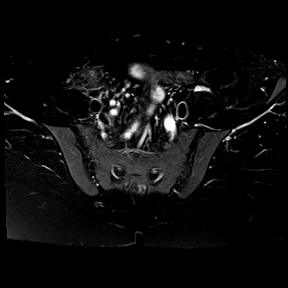
[im 40/40]
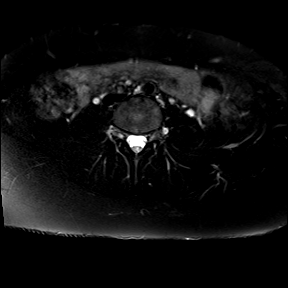

[Series 5: T2 · axial · 5.0mm · 0.81mm/px · z∈[-165,+69]mm · 4 of 40 slices shown (3 of 3)]
[im 1/40]
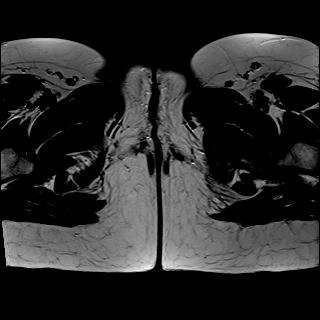
[im 14/40]
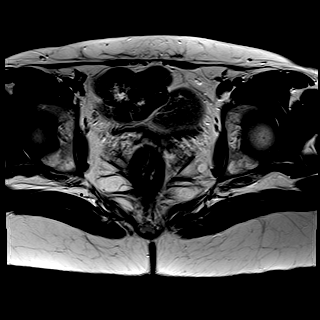
[im 27/40]
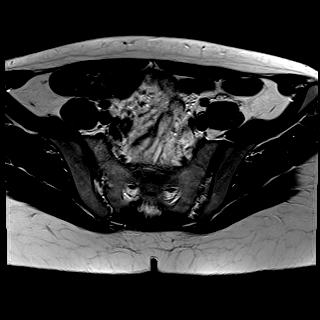
[im 40/40]
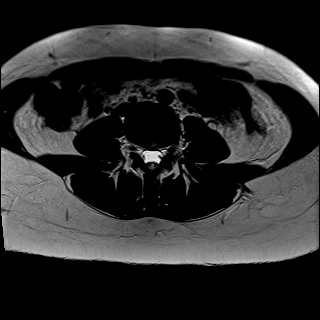

[Series 6: sag tse · sagittal · 5.0mm · 0.75mm/px · 3 of 33 slices shown]
[im 1/33]
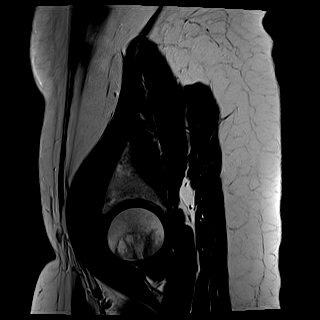
[im 17/33]
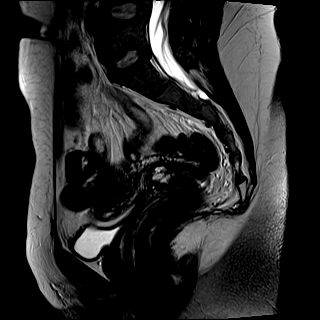
[im 33/33]
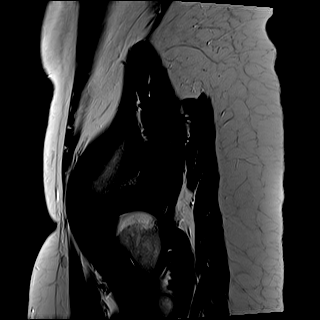

[Series 7: T1 dynamic fat-sat · axial · 3.0mm · 0.51mm/px · z∈[-176,+61]mm · 8 of 80 slices shown (1 of 2)]
[im 1/80]
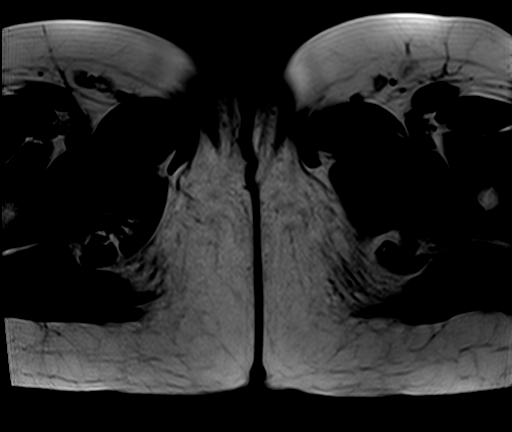
[im 12/80]
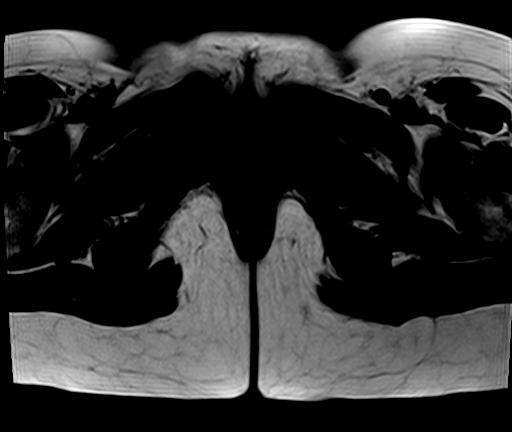
[im 23/80]
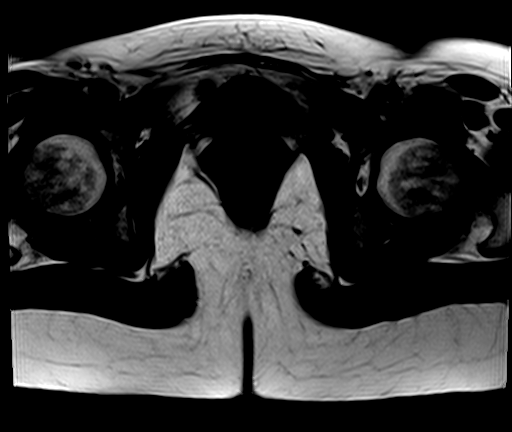
[im 34/80]
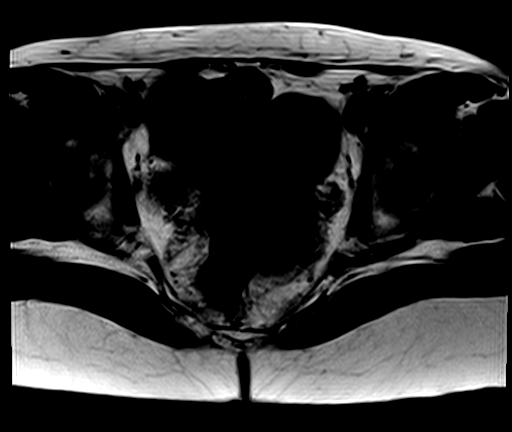
[im 46/80]
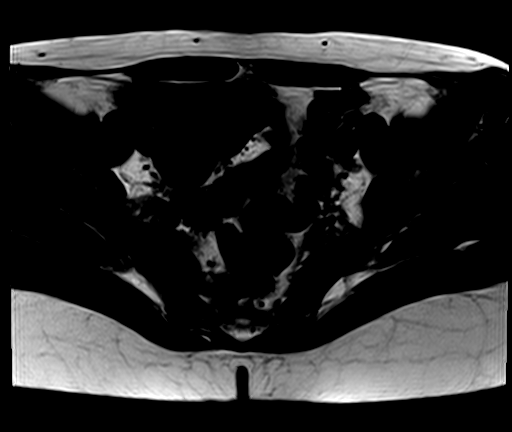
[im 57/80]
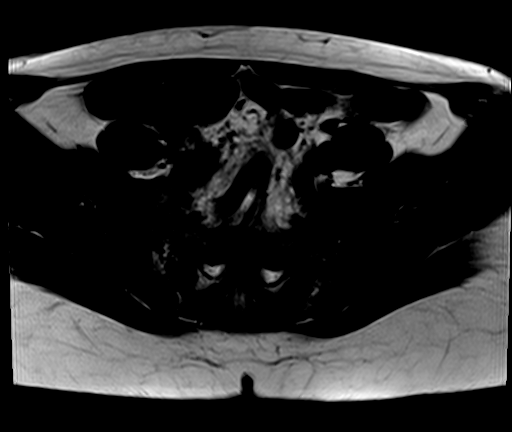
[im 68/80]
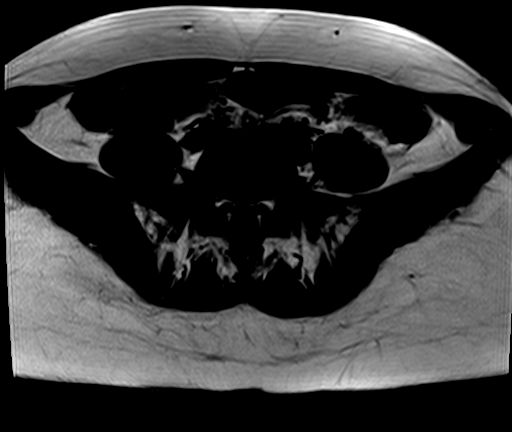
[im 80/80]
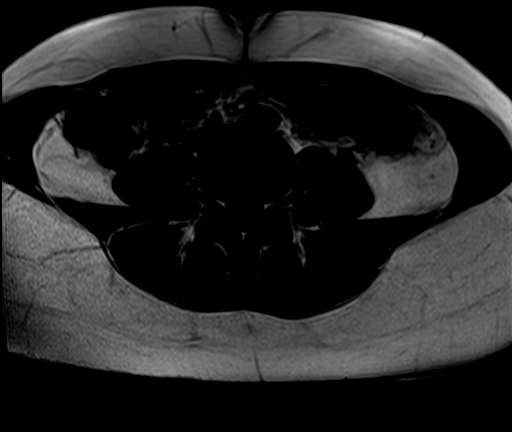

[Series 8: T1 dynamic fat-sat · axial · 3.0mm · 0.51mm/px · z∈[-176,+25]mm · 7 of 80 slices shown (2 of 2)]
[im 1/80]
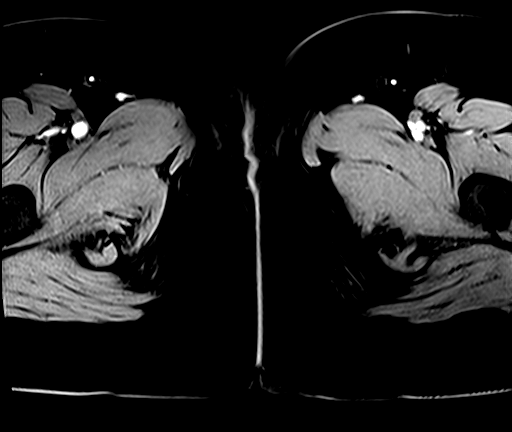
[im 12/80]
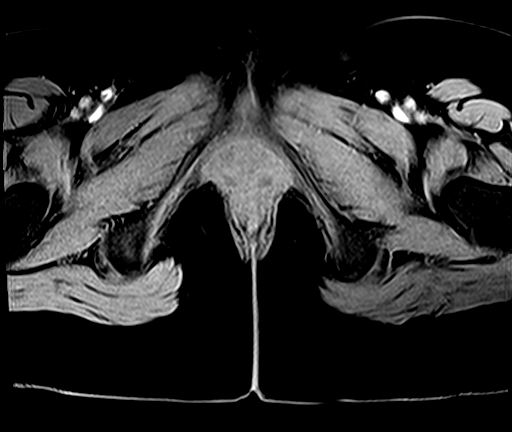
[im 23/80]
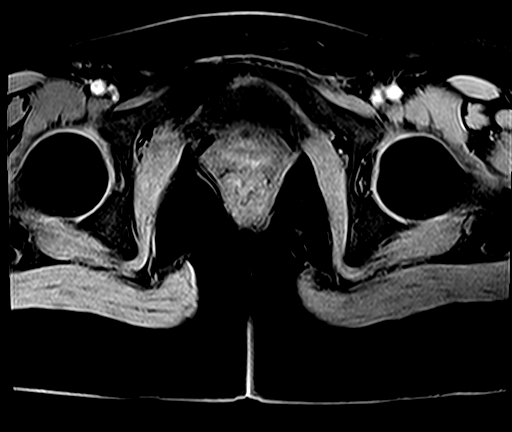
[im 34/80]
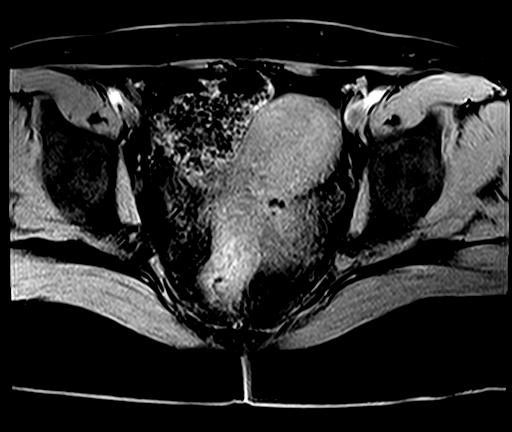
[im 46/80]
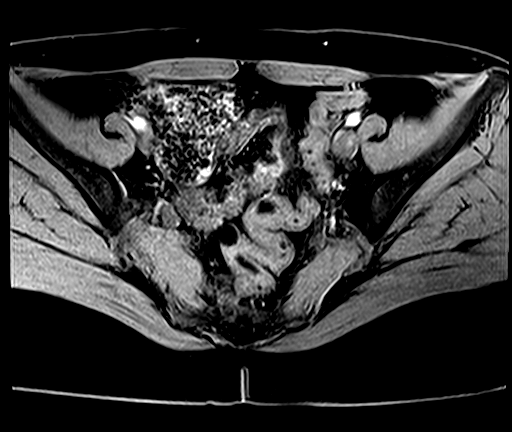
[im 57/80]
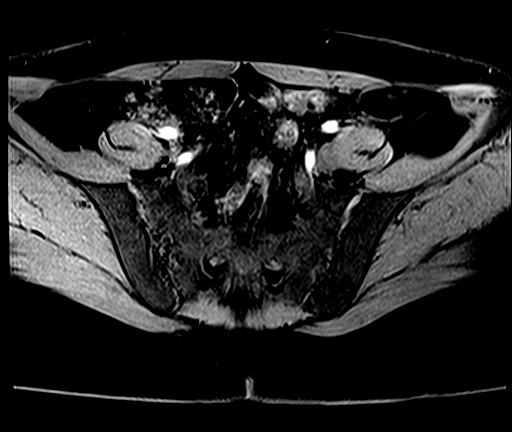
[im 68/80]
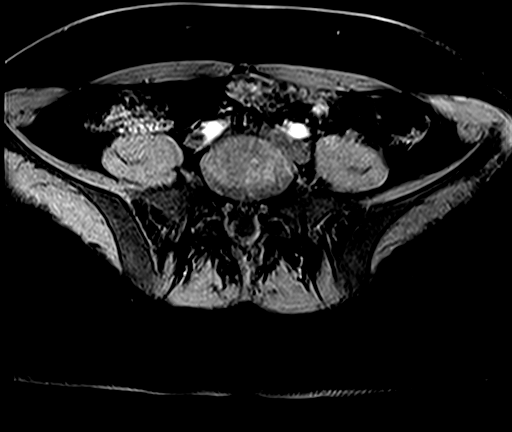

[32 of 48 positions shown; findings below may reference images not displayed]

FINDINGS: Urinary Tract:  Bladder is underdistended.

Bowel:  Visualized bowel is unremarkable.

Vascular/Lymphatic: No evidence of aneurysm.

No suspicious pelvic lymphadenopathy.

Reproductive: Uterus is within normal limits. Endometrial complex
measures 6 mm.

Bilateral ovaries are within normal limits, noting a 2.6 cm right
corpus luteum, physiologic.

Other:  No pelvic ascites.

On precontrast T1 imaging, there are no hemorrhagic lesions
demonstrating intrinsic T1 shortening to suggest endometriosis.

Musculoskeletal: No focal osseous lesions.
IMPRESSION: No findings suspicious for endometriosis on unenhanced MR.

2.6 cm physiologic right corpus luteum.

## 2023-06-08 ENCOUNTER — Other Ambulatory Visit (HOSPITAL_COMMUNITY): Payer: Self-pay | Admitting: Family Medicine

## 2023-06-08 ENCOUNTER — Ambulatory Visit
Admission: RE | Admit: 2023-06-08 | Discharge: 2023-06-08 | Disposition: A | Discharge status: Home / Self Care | Payer: 59 | Source: Ambulatory Visit | Attending: Family Medicine | Admitting: Family Medicine

## 2023-06-08 DIAGNOSIS — S3992XA Unspecified injury of lower back, initial encounter: Secondary | ICD-10-CM

## 2023-06-08 DIAGNOSIS — W1800XA Striking against unspecified object with subsequent fall, initial encounter: Secondary | ICD-10-CM

## 2023-06-08 DIAGNOSIS — M5416 Radiculopathy, lumbar region: Secondary | ICD-10-CM
